# Patient Record
Sex: Female | Born: 1945 | Hispanic: Yes | Marital: Married | Smoking: Never smoker
Health system: Western US, Academic
[De-identification: ages and names within clinical notes are randomized; demographics above are authoritative.]

## PROBLEM LIST (undated history)

## (undated) DIAGNOSIS — I509 Heart failure, unspecified: Secondary | ICD-10-CM

## (undated) DIAGNOSIS — I259 Chronic ischemic heart disease, unspecified: Secondary | ICD-10-CM

## (undated) DIAGNOSIS — G43909 Migraine, unspecified, not intractable, without status migrainosus: Secondary | ICD-10-CM

## (undated) DIAGNOSIS — I10 Essential (primary) hypertension: Secondary | ICD-10-CM

## (undated) DIAGNOSIS — F329 Major depressive disorder, single episode, unspecified: Secondary | ICD-10-CM

## (undated) HISTORY — DX: Major depressive disorder, single episode, unspecified: F32.9

## (undated) HISTORY — DX: Heart failure, unspecified (CMS-HCC): I50.9

## (undated) HISTORY — DX: Migraine, unspecified, not intractable, without status migrainosus: G43.909

## (undated) HISTORY — DX: Essential (primary) hypertension: I10

## (undated) HISTORY — DX: Chronic ischemic heart disease, unspecified: I25.9

---

## 2016-05-27 ENCOUNTER — Ambulatory Visit
Admission: RE | Admit: 2016-05-27 | Discharge: 2016-05-28 | Disposition: A | Discharge status: Home / Self Care | Payer: BLUE CROSS/BLUE SHIELD

## 2016-05-27 DIAGNOSIS — H903 Sensorineural hearing loss, bilateral: Principal | ICD-10-CM | POA: Insufficient documentation

## 2016-05-28 ENCOUNTER — Encounter (HOSPITAL_BASED_OUTPATIENT_CLINIC_OR_DEPARTMENT_OTHER): Payer: Self-pay | Admitting: Otolaryngology

## 2016-05-28 ENCOUNTER — Ambulatory Visit: Payer: BLUE CROSS/BLUE SHIELD | Attending: Otolaryngology | Admitting: Otolaryngology

## 2016-05-28 VITALS — BP 129/68 | HR 67 | Temp 98.2°F | Resp 14

## 2016-05-28 DIAGNOSIS — H9313 Tinnitus, bilateral: Principal | ICD-10-CM | POA: Insufficient documentation

## 2016-05-28 DIAGNOSIS — H9193 Unspecified hearing loss, bilateral: Secondary | ICD-10-CM | POA: Insufficient documentation

## 2016-05-28 NOTE — Progress Notes (Signed)
Otolaryngology   Head and Neck Surgery  Facial Plastic and Reconstructive Surgery    PCP:  Reather Converse [facility], [address]  [place] [address]    Alison Mora    [id number]  08/28/1946    Dear ??Dr. Deloria Lair,    I had the pleasure of seeing your patient in the office today. The patient has had hearing loss since the 2000s.  She is interested in hearing aids.  She has had bothersome tinnitus during the day.    The patient has  trouble with background noise.  The patient has no vertigo.  The patient has no ear fullness.  The patient has no fluctuation in hearing.  The patient has no history of ear surgery.  The patient does not recall any history of noise trauma.    The audiogram was reviewed with the patient today.      Past Medical History:   Diagnosis Date   . Congestive heart failure (CMS-HCC)    . Hypertension    . Ischemic heart disease    . Major depressive disorder, single episode    . Migraine        No past surgical history on file.    No current outpatient prescriptions on file.     No current facility-administered medications for this visit.        Allergies:No Known Allergies     Social History   Substance Use Topics   . Smoking status: Never Smoker   . Smokeless tobacco: Not on file   . Alcohol use No       Family History   Problem Relation Age of Onset   . Diabetes Daughter        Occupation:  none    ROS: 12 systems negative except for that above   No fevers, chills, ear pain, cough, heartburn, stiffness, rash, numbness, bruising, tension, sweating, hay fever    Vitals:    05/28/16 1329   BP: 129/68   BP cuff size: Large   Pulse: 67   Resp: 14   Temp: 98.2 F (36.8 C)   TempSrc: Oral       ?PHYSICAL EXAMINATION: ?Today, ??she?? is well-appearing and in no acute distress.?  General: No acute distress, alert and oriented, unrestrained voice  Skin: warm and dry  Eyes: EOMI, PERRL  Face: CN7 no droop no deficit  Ears: tms, canals, external appearance healthy  Nose:  septum with curvature, turbinates moist  OC/OP: mucous membranes moist no lesions  Lips, gums, dentition, mucosa, pharyngeal walls: no lesions  Salivary glands: no masses  Thyroid: not enlarged  Neck: no cervical supraclavicular adenopathy  Chest: no intercostal retractions  Neuro: no tremor  Cardio: wrist pulse regular    IMPRESSION/PLAN:    Hearing loss:  I discussed hearing aids.  I counseled the patient about the condition. Hearing aid clearance provided.    ?Tinnitus:  I counseled the patient about the condition.  We discussed other treatment options for tinnitus, including medical treatments which are less well proven and have associated medical side effects. We discussed that those side effects may often be more problematic than the tinnitus itself, so we reserve those for more debilitating cases. We also discussed behavioral feedback therapy and that surgical options for tinnitus are limited. Some patients have reported improvement with acupuncture.  I have asked the patient to return if there is any change in otological symptoms, specifically the sensation of spinning or vertigo, ear  fullness, hearing loss or increased tinnitus.  She wishes to try acupuncture.        Thank you for entrusting me with the care of your patient. Should you have any questions or concerns, please feel free to contact me.     Regards,    _________________________________  Tarri GlennSapideh Heinrich Fertig, MD   Associate Professor  Division of Otolaryngology - Head and Neck Surgery   Facial Plastic and Reconstructive Surgery

## 2016-05-28 NOTE — Progress Notes (Addendum)
Results indicated a mild to moderate-severe sloping SN hearing loss bilaterally.  She was inconsistent at soft levels; her responses may be mildly elevated.  See scanned Audiogram under 'Media' tab.    Word recognition was 80% bilaterally.    Tympanometry showed normal TM mobility in her left ear with retraction and reduced compliance in her right ear (-140mm).  Acoustic reflexes were questionable/absent probe right.  Probe left, acoustic reflexes were present with ipsi stim; with contra stim reflexes appeared to be present at 500 Hz only.    Results were explained to Alison Mora and her daughter-in-law by our hospital Spanish interpreter.  Amplification was recommended following medical clearance.  The process for obtaining hearing aids through her insurance was explained to her daughter-in-law.    Alison Mora has an appointment tomorrow in [facility]

## 2016-09-02 ENCOUNTER — Telehealth (HOSPITAL_BASED_OUTPATIENT_CLINIC_OR_DEPARTMENT_OTHER): Payer: Self-pay | Admitting: Otolaryngology

## 2016-09-02 NOTE — Telephone Encounter (Signed)
Faxed 8 pages to [employer] including hearing aid prescription, audiogram, and notes from clinic visit with Dr. Eldridge ScotGilani and with Audiology. Received transmission verification that all 8 pages had been received.

## 2016-09-02 NOTE — Telephone Encounter (Signed)
Morrie [employer]ld[employer] from [employer] is calling stating that they received authorization from [employer] but has not received any audiogram or notes from the doctor. They're wondering if our doctor is referring patient for hearing aids at [facility]? She states she called patient's care center and they told her that [place] requested this authorization to [employer]. Please review and contact [employer]: [phone].

## 2023-10-20 IMAGING — MR JOELHO E
6 series · 40 of 40 positions shown · non-contrast
Comparison: none

[Series 2: te 15 fast · axial · left · 10.0mm · 0.98mm/px · z∈[-15,+115]mm · 2 of 3 slices shown]
[im 1/3]
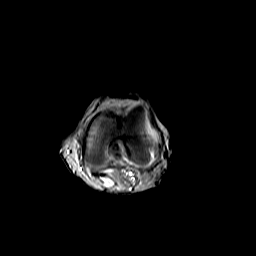
[im 3/3]
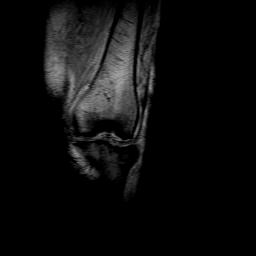

[Series 3: sagital dp · sagittal · left · 3.5mm · 0.39mm/px · 8 of 19 slices shown]
[im 1/19]
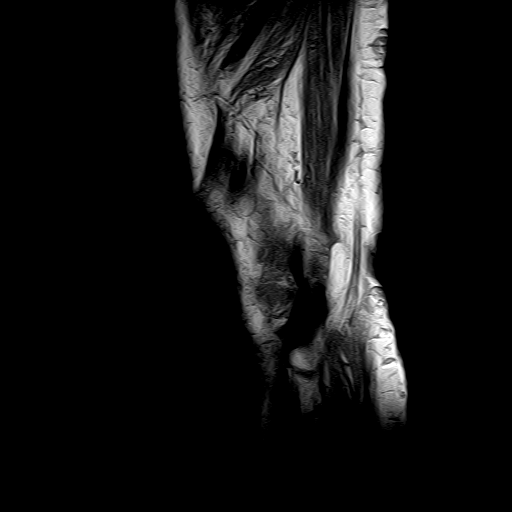
[im 3/19]
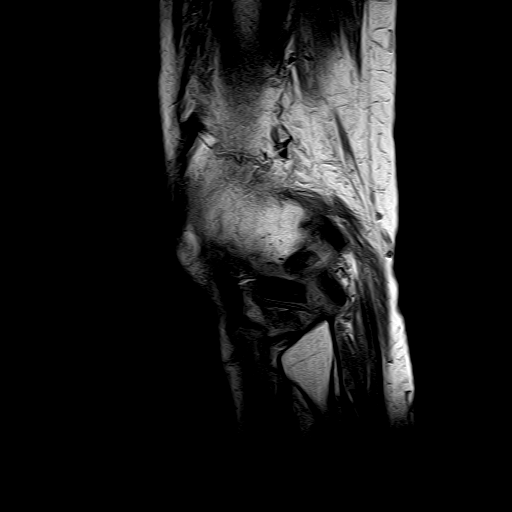
[im 6/19]
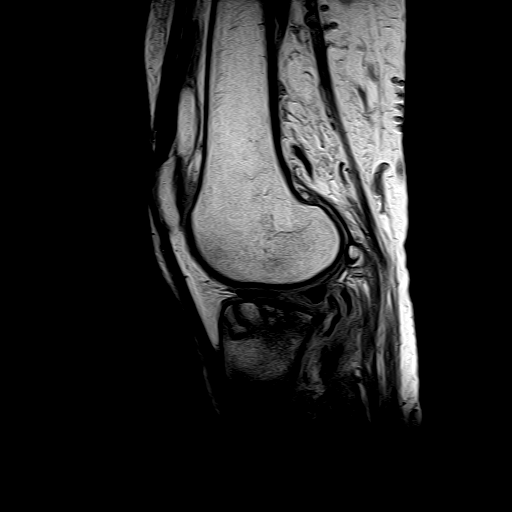
[im 8/19]
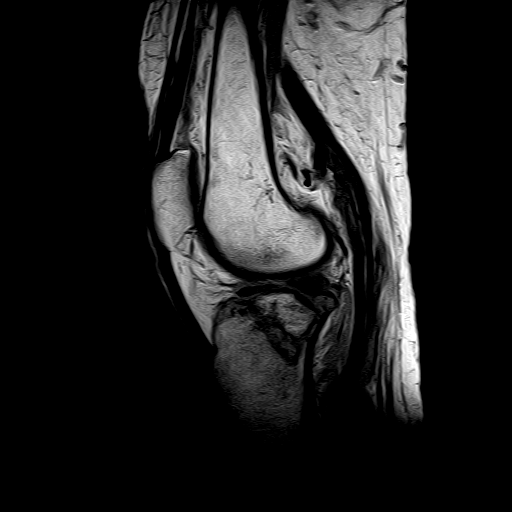
[im 11/19]
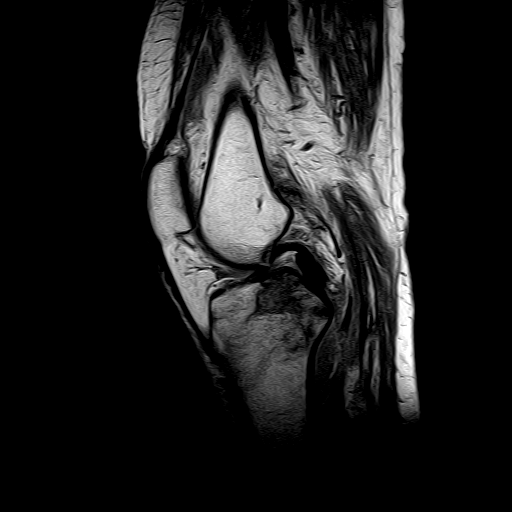
[im 13/19]
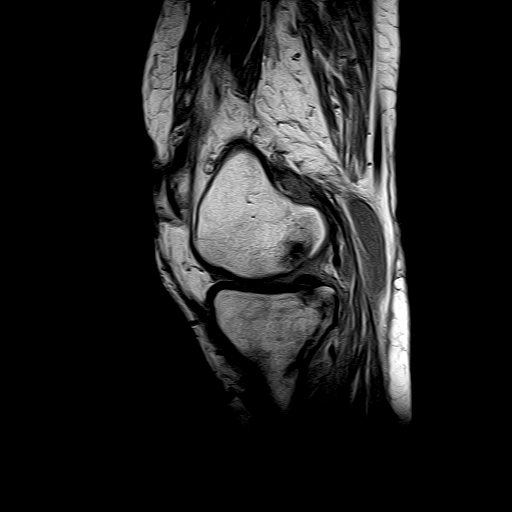
[im 16/19]
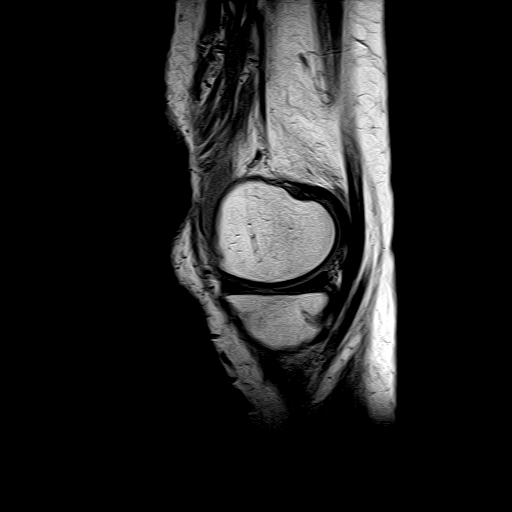
[im 19/19]
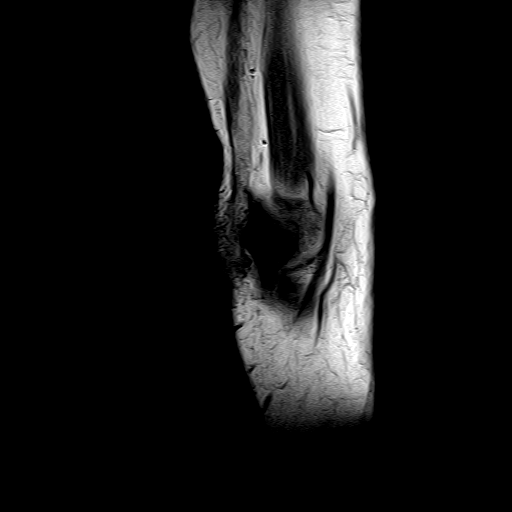

[Series 4: T2 fat-sat · coronal · left · 3.0mm · 0.78mm/px · 9 of 20 slices shown (1 of 2)]
[im 1/20]
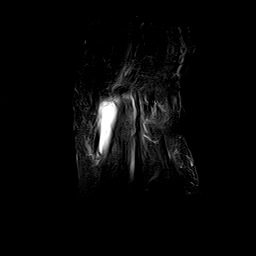
[im 3/20]
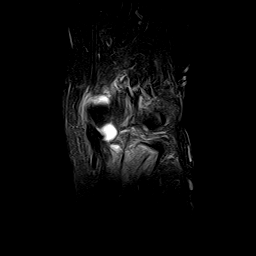
[im 5/20]
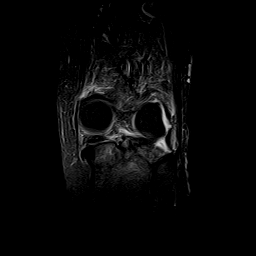
[im 8/20]
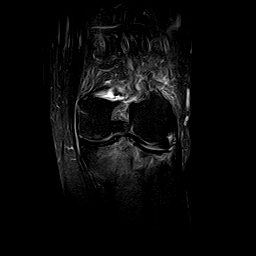
[im 10/20]
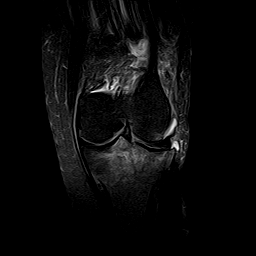
[im 12/20]
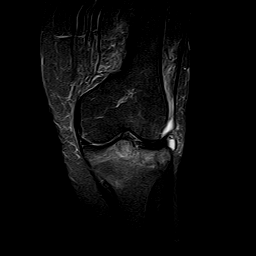
[im 15/20]
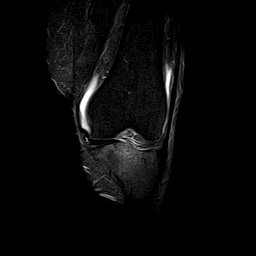
[im 17/20]
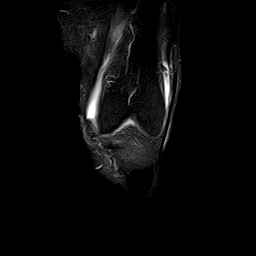
[im 20/20]
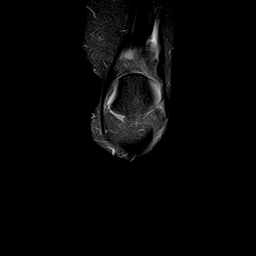

[Series 5: T2 fat-sat · axial · left · 3.5mm · 0.74mm/px · z∈[-46,+36]mm · 9 of 20 slices shown (2 of 2)]
[im 1/20]
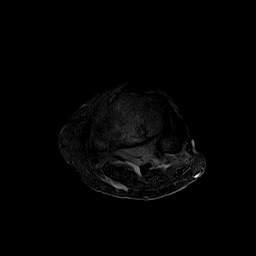
[im 3/20]
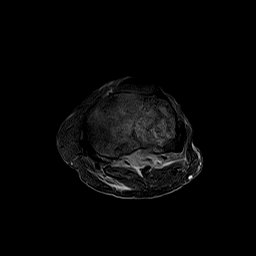
[im 5/20]
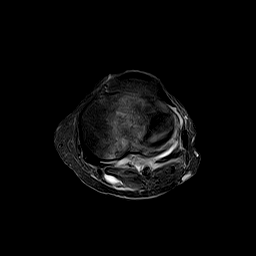
[im 8/20]
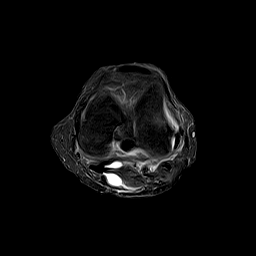
[im 10/20]
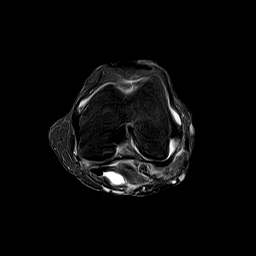
[im 12/20]
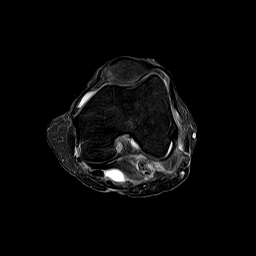
[im 15/20]
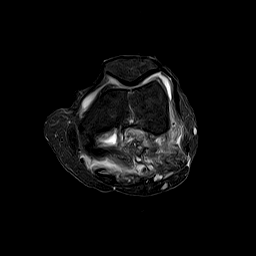
[im 17/20]
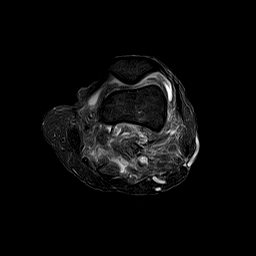
[im 20/20]
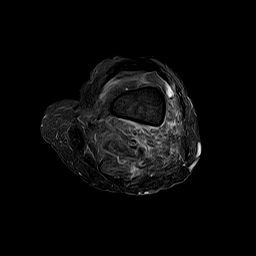

[Series 6: T2 · oblique · left · 2.0mm · 0.66mm/px · 4 of 10 slices shown]
[im 1/10]
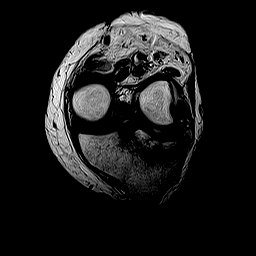
[im 4/10]
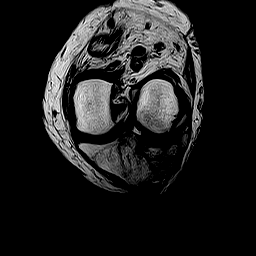
[im 7/10]
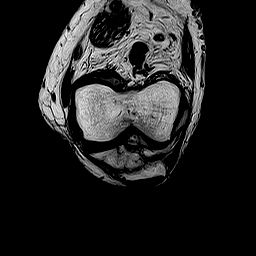
[im 10/10]
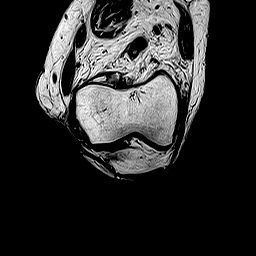

[Series 7: STIR · sagittal · left · 3.5mm · 0.78mm/px · 8 of 19 slices shown]
[im 1/19]
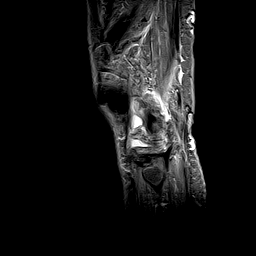
[im 3/19]
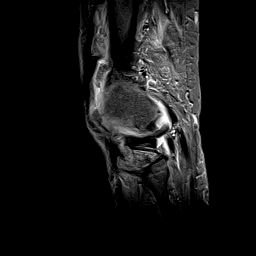
[im 6/19]
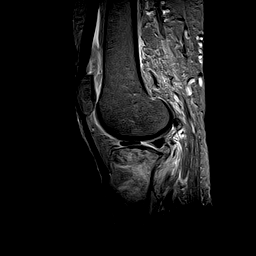
[im 8/19]
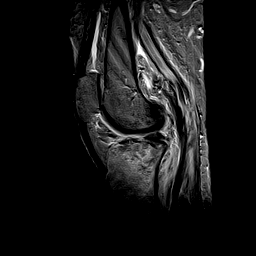
[im 11/19]
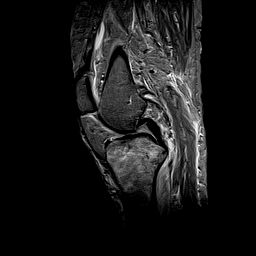
[im 13/19]
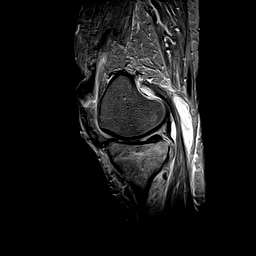
[im 16/19]
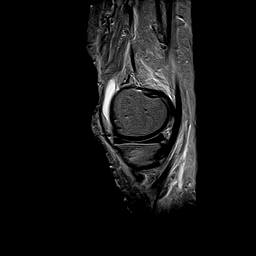
[im 19/19]
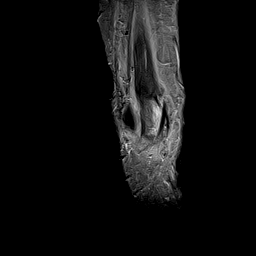

[40 of 40 positions shown; findings below may reference images not displayed]

[employer]:
Exame realizado através de sequências multiplanares ponderadas em T2 e DP, com e sem supressão do sinal da
gordura. 
Análise:
Estrutura óssea com sinal normal.
Relações articulares mantidas.
Cartilagem patelar tem espessura levemente afilada com sinal heterogêneo elevado compatível com condropatia.
Gordura de Hoffa preservada.
Ausência de derrame articular.
Menisco medial tem sinal elevado heterogêneo em seu corno posterior com extensão articular compatível com
lesão.
Menisco lateral sem tem sinal elevado em seu corpo compatível com lesão.
Alteração do sinal do ligamento cruzado anterior, sem franca solução de continuidade de suas fibras, com aspecto
degenerativo. 
Ligamento cruzado posterior sinuoso.
Ligamento colateral medial sem alterações.
Ligamento colateral lateral sem alterações.
Tendão do quadríceps de aspecto normal.
Tendão patelar de aspecto normal.
Leve infiltração edematosa no entorno das fibras dos tendões da pata anserina compatível com tendinopatia.
Fossa poplítea anatômica.

## 2023-10-20 IMAGING — MR JOELHO D
6 series · 40 of 40 positions shown · non-contrast
Comparison: none

[Series 2: te 15 fast · axial · right · 10.0mm · 0.90mm/px · 1 of 3 slices shown]
[im 1/3]
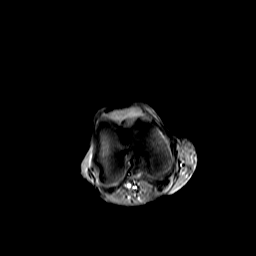

[Series 3: sagital dp · sagittal · right · 3.5mm · 0.39mm/px · 9 of 20 slices shown]
[im 1/20]
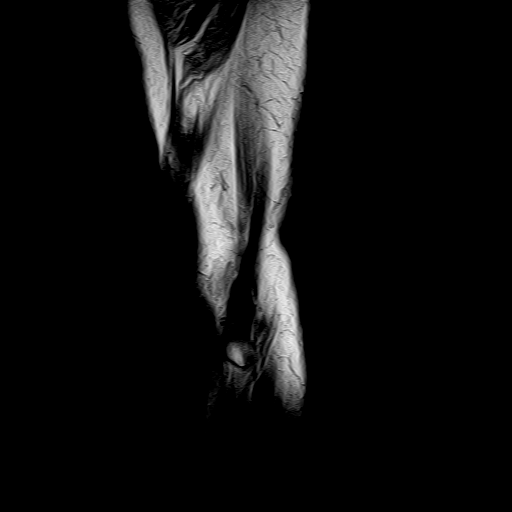
[im 3/20]
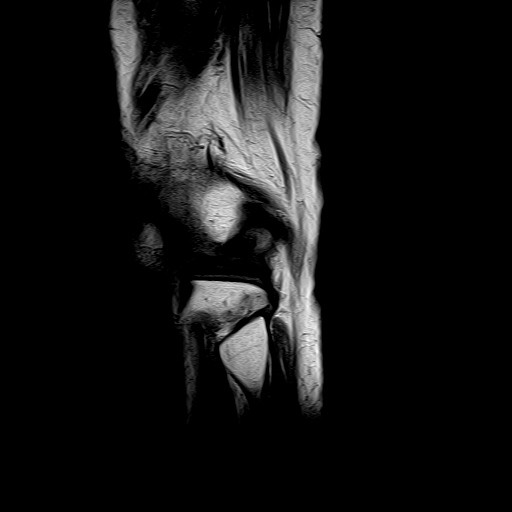
[im 5/20]
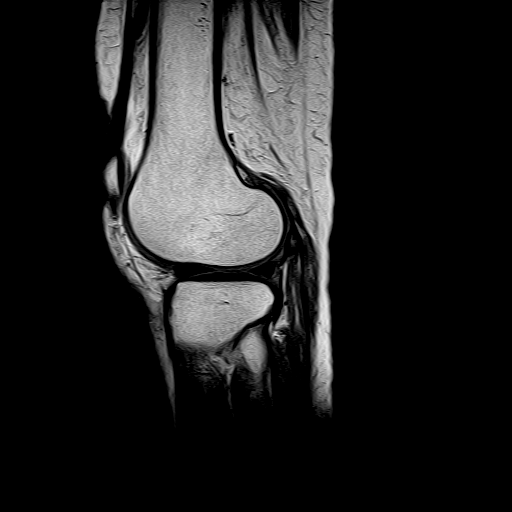
[im 8/20]
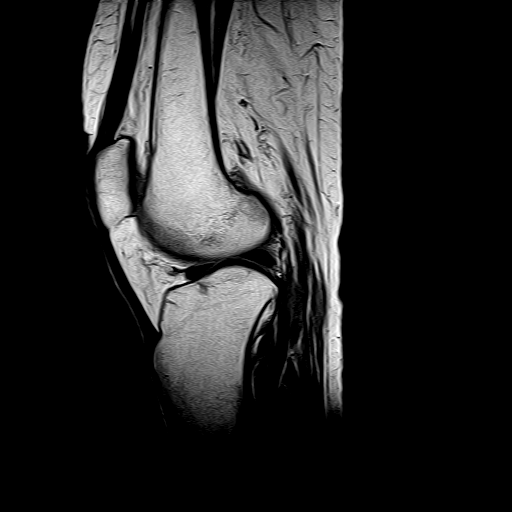
[im 10/20]
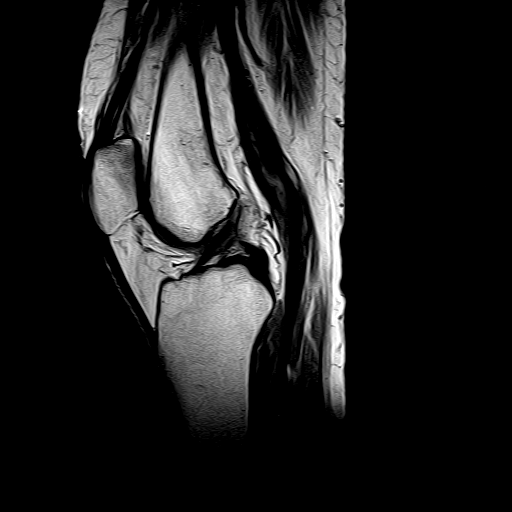
[im 12/20]
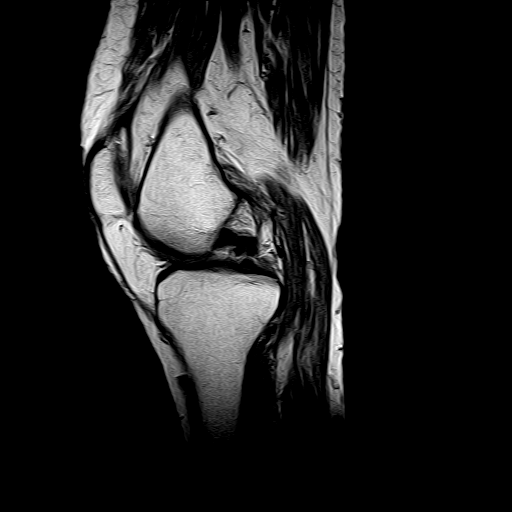
[im 15/20]
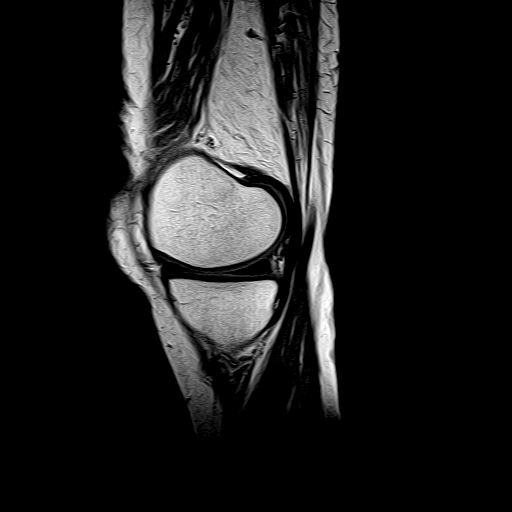
[im 17/20]
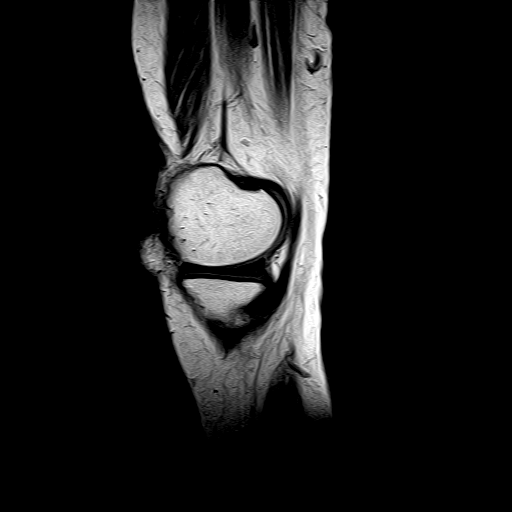
[im 20/20]
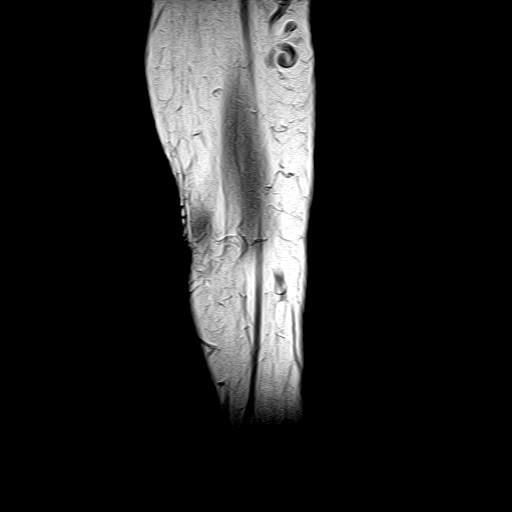

[Series 4: T2 fat-sat · sagittal · right · 3.5mm · 0.78mm/px · 9 of 20 slices shown (1 of 3)]
[im 1/20]
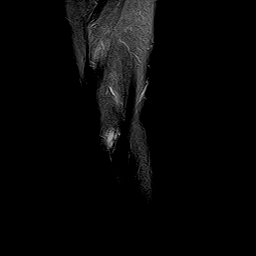
[im 3/20]
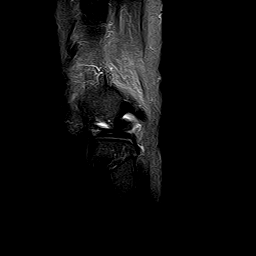
[im 5/20]
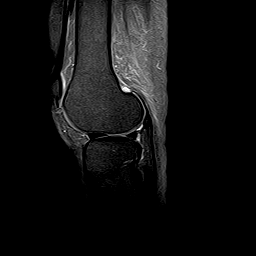
[im 8/20]
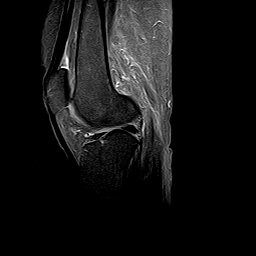
[im 10/20]
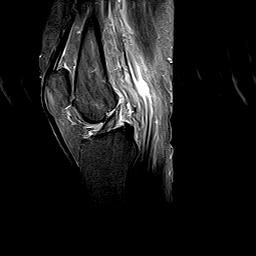
[im 12/20]
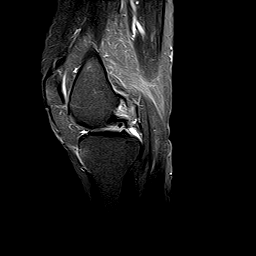
[im 15/20]
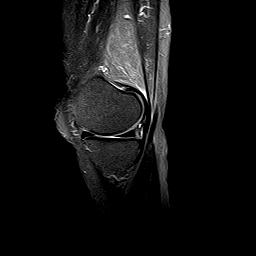
[im 17/20]
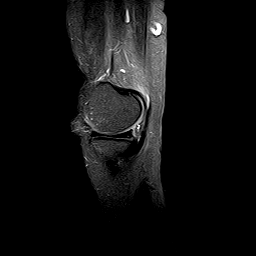
[im 20/20]
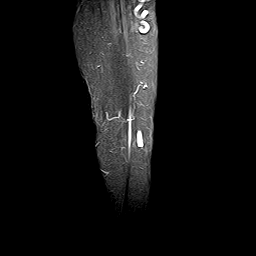

[Series 5: T2 fat-sat · coronal · right · 3.0mm · 0.78mm/px · 9 of 20 slices shown (2 of 3)]
[im 1/20]
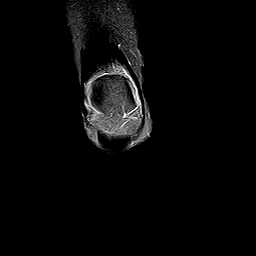
[im 3/20]
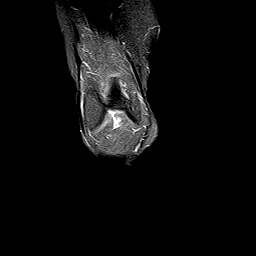
[im 5/20]
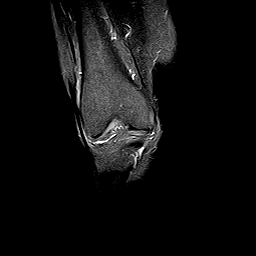
[im 8/20]
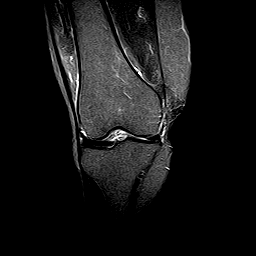
[im 10/20]
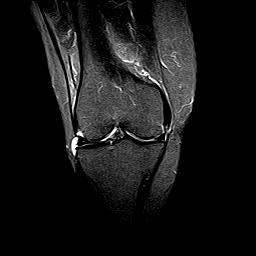
[im 12/20]
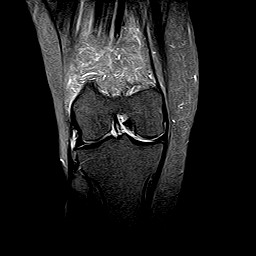
[im 15/20]
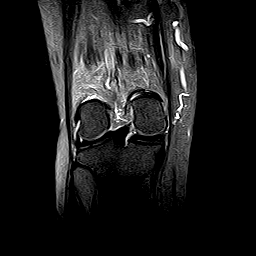
[im 17/20]
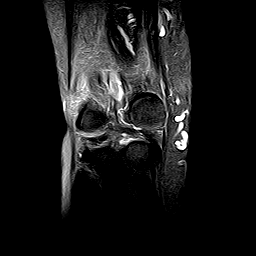
[im 20/20]
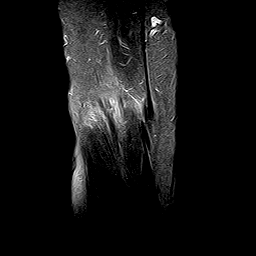

[Series 6: T2 fat-sat · axial · right · 3.5mm · 0.74mm/px · z∈[-46,+27]mm · 8 of 18 slices shown (3 of 3)]
[im 1/18]
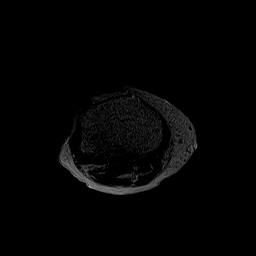
[im 3/18]
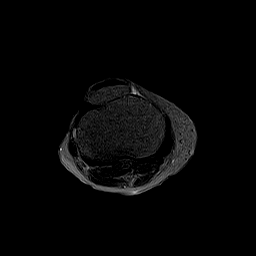
[im 5/18]
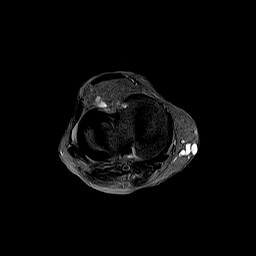
[im 8/18]
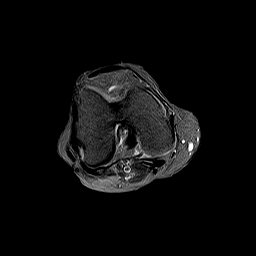
[im 10/18]
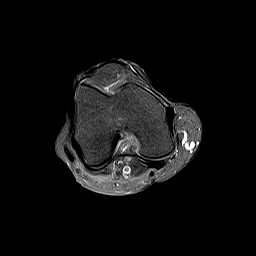
[im 13/18]
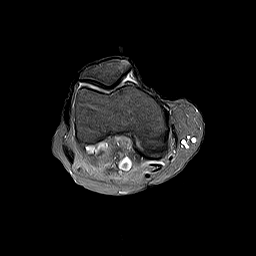
[im 15/18]
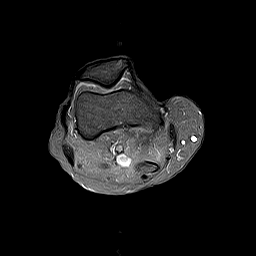
[im 18/18]
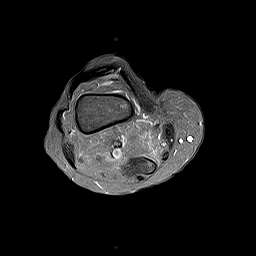

[Series 7: T2 · coronal · right · 2.0mm · 0.66mm/px · 4 of 10 slices shown]
[im 1/10]
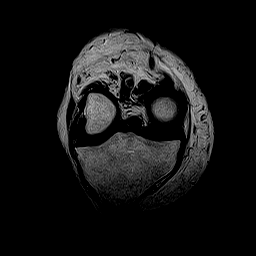
[im 4/10]
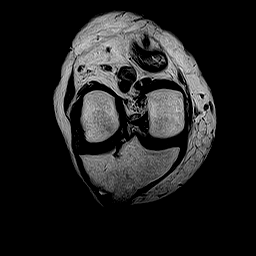
[im 7/10]
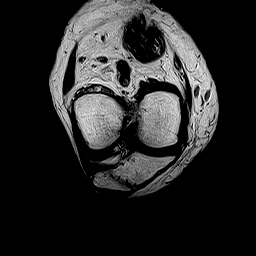
[im 10/10]
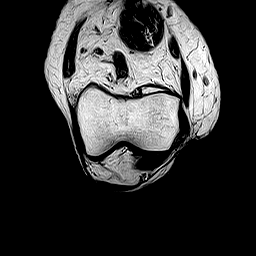

[40 of 40 positions shown; findings below may reference images not displayed]

[employer]:
Exame realizado através de sequências multiplanares ponderadas em T2 e DP, com e sem supressão do sinal da
gordura. 
Análise:
Estrutura óssea com sinal normal.
Relações articulares mantidas.
Cartilagem patelar tem espessura levemente afilada com sinal heterogêneo elevado compatível com condropatia.
Gordura de Hoffa preservada.
Ausência de derrame articular.
Menisco medial tem sinal elevado heterogêneo em seu corno posterior com extensão articular compatível com
lesão.
Menisco lateral sem tem sinal elevado em seu corpo compatível com lesão.
Alteração do sinal do ligamento cruzado anterior, sem franca solução de continuidade de suas fibras, com aspecto
degenerativo. 
Ligamento cruzado posterior sinuoso.
Ligamento colateral medial sem alterações.
Ligamento colateral lateral sem alterações.
Tendão do quadríceps de aspecto normal.
Tendão patelar de aspecto normal.
Leve infiltração edematosa no entorno das fibras dos tendões da pata anserina compatível com tendinopatia.
Fossa poplítea anatômica.
